# Patient Record
Sex: Female | Born: 1970 | Race: White | Hispanic: No | Marital: Married | Smoking: Never smoker
Health system: Southern US, Community
[De-identification: ages and names within clinical notes are randomized; demographics above are authoritative.]

## PROBLEM LIST (undated history)

## (undated) DIAGNOSIS — K219 Gastro-esophageal reflux disease without esophagitis: Secondary | ICD-10-CM

## (undated) DIAGNOSIS — G43909 Migraine, unspecified, not intractable, without status migrainosus: Secondary | ICD-10-CM

## (undated) HISTORY — DX: Migraine, unspecified, not intractable, without status migrainosus: G43.909

## (undated) HISTORY — PX: DILATION AND CURETTAGE OF UTERUS: SHX78

## (undated) HISTORY — DX: Gastro-esophageal reflux disease without esophagitis: K21.9

---

## 1997-05-12 HISTORY — PX: OTHER SURGICAL HISTORY: SHX169

## 2002-05-12 HISTORY — PX: CHOLECYSTECTOMY: SHX55

## 2010-02-18 ENCOUNTER — Ambulatory Visit: Payer: Self-pay | Admitting: Internal Medicine

## 2010-02-27 ENCOUNTER — Ambulatory Visit: Payer: Self-pay | Admitting: Internal Medicine

## 2010-02-27 ENCOUNTER — Ambulatory Visit (HOSPITAL_COMMUNITY): Admission: RE | Admit: 2010-02-27 | Discharge: 2010-02-27 | Payer: Self-pay | Admitting: Internal Medicine

## 2010-03-25 ENCOUNTER — Ambulatory Visit: Payer: Self-pay | Admitting: Internal Medicine

## 2012-05-12 HISTORY — PX: BREAST ENHANCEMENT SURGERY: SHX7

## 2013-03-02 ENCOUNTER — Ambulatory Visit (INDEPENDENT_AMBULATORY_CARE_PROVIDER_SITE_OTHER): Payer: BC Managed Care – PPO | Admitting: Neurology

## 2013-03-02 ENCOUNTER — Encounter: Payer: Self-pay | Admitting: Neurology

## 2013-03-02 VITALS — BP 100/76 | HR 60 | Temp 98.1°F | Ht 60.25 in | Wt 111.0 lb

## 2013-03-02 DIAGNOSIS — R200 Anesthesia of skin: Secondary | ICD-10-CM

## 2013-03-02 DIAGNOSIS — R51 Headache: Secondary | ICD-10-CM

## 2013-03-02 DIAGNOSIS — R209 Unspecified disturbances of skin sensation: Secondary | ICD-10-CM

## 2013-03-02 MED ORDER — RIZATRIPTAN BENZOATE 10 MG PO TBDP: 10.0000 mg | ORAL_TABLET | ORAL | Status: DC | PRN

## 2013-03-02 MED ORDER — AMITRIPTYLINE HCL 10 MG PO TABS: 10.0000 mg | ORAL_TABLET | Freq: Every day | ORAL | Status: DC

## 2013-03-02 NOTE — Progress Notes (Signed)
NEUROLOGY CONSULTATION NOTE  Denise Warren MRN: [id number] DOB: 18-Oct-1970  Referring provider: Dr. Sherril Croon Primary care provider: Dr. Sherril Croon  Reason for consult:  Chronic daily headaches.  HISTORY OF PRESENT ILLNESS: Denise Warren is a 42 year old right-handed woman with back pain and biliary dyskniesia who presents for headache.  She is accompanied by her husband.  Records and images were personally reviewed where available.    Onset:  August.  Two types. Location:  1) Type is at base of skull to holocephalic.  2) bi-temporal radiating to top of head (in coronal distribution).  On occasion, brief stabbing pain in left ear. Quality:  1)  Dull, nonthrobbing, burning scalp at back of head.  2)  pounding Intensity:  1)  5-6/10.  2)  8/10 Associated symptoms:  Nausea, photophobia, phonophobia, osmophobia, sound of frying bacon in right ear.  Swiggly dark lines in both eyes.  Also notes some mild numbness in right side of face. Aura:  No Duration:  1)  Constant.  2) 3 days Frequency:  1) Constant (about 12-15 headache-free days over past 3 months).  2)  1 day a week Activity:  Able to remain active but difficult.  Reduced appetite. Triggers/exacerbating factors:  none Relieving factors:  none  Past abortive therapy:  Imitrex (takes edge off), Alleve (ineffective) Past preventative therapy:  Propranolol (bradycardia, light headed)  Current abortive therapy: Imitrex 100mg  (takes edge off), Nubain (ineffective), Phenergan  Current preventative therapy:  Topamax 200mg  (ineffective)  Alcohol:  no Caffeine:  no Sleep hygiene:  Sleeps okay Stress/depression:  no Personal history of headache:  Yes.  Migraines, usually right periorbital, pulsating, once every 2-3 months.  Responded well to imitrex Family history of headache:  Mom, maternal aunt.  PAST MEDICAL HISTORY: Past Medical History  Diagnosis Date  . Migraines   . GERD (gastroesophageal reflux disease)     PAST SURGICAL  HISTORY: Past Surgical History  Procedure Laterality Date  . Right breast lumpectomy  1999  . Cholecystectomy  2004  . Dilation and curettage of uterus    . Breast enhancement surgery  2014    MEDICATIONS: No current outpatient prescriptions on file prior to visit.   No current facility-administered medications on file prior to visit.    ALLERGIES: Allergies  Allergen Reactions  . Sulfa Antibiotics     Stomach cramps; vomiting    FAMILY HISTORY: No family history on file.  SOCIAL HISTORY: History   Social History  . Marital Status: Married    Spouse Name: N/A    Number of Children: N/A  . Years of Education: N/A   Occupational History  . Not on file.   Social History Main Topics  . Smoking status: Never Smoker   . Smokeless tobacco: Never Used  . Alcohol Use: No     Comment: once a month  . Drug Use: No  . Sexual Activity: Not on file   Other Topics Concern  . Not on file   Social History Narrative  . No narrative on file    REVIEW OF SYSTEMS: Constitutional: No fevers, chills, or sweats, no generalized fatigue, change in appetite Eyes: No visual changes, double vision, eye pain Ear, nose and throat: No hearing loss, ear pain, nasal congestion, sore throat Cardiovascular: No chest pain, palpitations Respiratory:  No shortness of breath at rest or with exertion, wheezes GastrointestinaI: No nausea, vomiting, diarrhea, abdominal pain, fecal incontinence Genitourinary:  No dysuria, urinary retention or frequency Musculoskeletal:  No neck pain, back  pain Integumentary: No rash, pruritus, skin lesions Neurological: as above Psychiatric: No depression, insomnia, anxiety Endocrine: No palpitations, fatigue, diaphoresis, mood swings, change in appetite, change in weight, increased thirst Hematologic/Lymphatic:  No anemia, purpura, petechiae. Allergic/Immunologic: no itchy/runny eyes, nasal congestion, recent allergic reactions, rashes  PHYSICAL EXAM: Filed  Vitals:   03/02/13 1342  BP: 100/76  Pulse: 60  Temp: 98.1 F (36.7 C)   General: In mild distress.  Sitting with lights out. Head:  Normocephalic/atraumatic Neck: supple, bilateral paraspinal tenderness with particular tenderness at occipital notch, full range of motion Back: No paraspinal tenderness Heart: regular rate and rhythm Lungs: Clear to auscultation bilaterally. Vascular: No carotid bruits. Neurological Exam: Mental status: alert and oriented to person, place, and time, speech fluent and not dysarthric, language intact. Cranial nerves: CN I: not tested CN II: pupils equal, round and reactive to light, visual fields intact, fundi unremarkable. CN III, IV, VI:  full range of motion, no nystagmus, no ptosis CN V: endorses mild reduced sensation of right V2 distribution. CN VII: upper and lower face symmetric CN VIII: hearing intact CN IX, X: gag intact, uvula midline CN XI: sternocleidomastoid and trapezius muscles intact CN XII: tongue midline Bulk & Tone: normal, no fasciculations. Motor: 5/5 throughout Sensation: temperature and vibration intact Deep Tendon Reflexes: 2+ throughout, toes down Finger to nose testing: normal Heel to shin: normal Gait: normal.  Able to walk on toes, heels and in tandem. Romberg negative.  IMPRESSION: 1.  Chronic migraine without aura. 2.  Occipital neuralgia  PLAN: 1.  Performed bilateral occipital nerve blocks in office. 2.  Start amitriptyline 10mg  qhs.  Continue topamax 200mg .  Call in one month and may adjust dose if needed. 3.  For abortive therapy, try Maxalt. 4.  Instructed to call in one week.  If headaches not improved and still constant, will prescribe prednisone taper. 5.  Follow up in 2 months.  Thank you for allowing me to take part in the care of this patient.  Shon Millet, DO  CC:  Doreen Beam, MD

## 2013-03-02 NOTE — Patient Instructions (Addendum)
1.  We gave you bilateral occipital nerve blocks today.  Hopefully that will relieve some of the headache. 2.  Start taking the amitriptyline 10mg  at bedtime.  Side effects include sleepiness and dizziness.  Call in one month with update. 3.  Call in one week.  If no relief, then I will prescribe you prednisone taper to try and break cycle of headache. 4.  For acute headache attacks, take Maxalt at earliest onset of headache.  May repeat in 2 hours if needed.  Do not take pain medications more than 2 days out of the week to prevent rebound headache. 5.  Follow up in 2 months. 6.  Since this is a new severe headache, we will get MRI.   Your MRI is scheduled at [facility] on Wednesday, October 29th at 4:00 pm. Please check in at the first floor radiology department 15 minutes prior to your scheduled appointment time. Enter the [facility].     [phone].

## 2013-03-03 NOTE — Procedures (Signed)
Procedure: Occipital nerve block  Procedure risks, benefits and alternative treatments explained to patient and they agree to proceed.   Risks and benefits discussed with patient prior to procedure and patient wished to proceed. A concentration of 0.25% bupivacaine (1 ml) was mixed with 40 mg of Kenalog (1 ml). A 22 gauge needle was used for injection. The region of bilateral greater occipital nerves was located by palpation. The area was prepped with alcohol. A total of 4 cc of the above mixture was injected without difficulty. The patient felt lightheaded briefly and laid down on the table for a few minutes, but then felt well.    Adam R. Everlena Cooper, DO

## 2013-03-09 ENCOUNTER — Ambulatory Visit (HOSPITAL_COMMUNITY)
Admission: RE | Admit: 2013-03-09 | Discharge: 2013-03-09 | Disposition: A | Discharge status: Home / Self Care | Payer: BC Managed Care – PPO | Source: Ambulatory Visit | Attending: Neurology | Admitting: Neurology

## 2013-03-09 DIAGNOSIS — R200 Anesthesia of skin: Secondary | ICD-10-CM

## 2013-03-09 DIAGNOSIS — R209 Unspecified disturbances of skin sensation: Secondary | ICD-10-CM | POA: Insufficient documentation

## 2013-03-09 DIAGNOSIS — R51 Headache: Secondary | ICD-10-CM | POA: Insufficient documentation

## 2013-03-09 MED ORDER — GADOBENATE DIMEGLUMINE 529 MG/ML IV SOLN
10.0000 mL | Freq: Once | INTRAVENOUS | Status: AC
  Administered 2013-03-09: 10 mL via INTRAVENOUS

## 2013-03-10 ENCOUNTER — Telehealth: Payer: Self-pay | Admitting: Neurology

## 2013-03-10 NOTE — Telephone Encounter (Signed)
Spoke with Denise Warren. Information given as per Dr. Everlena Cooper below re: MRI. She reports that her HA were markedly better up until last Wednesday--had a dull HA all day long but today she is again HA free. She states she thinks things are better overall. I encouraged her to call if she had significant changes. She states she will. **Dr. Everlena Cooper, [place] Childes.

## 2013-03-10 NOTE — Telephone Encounter (Signed)
Message copied by Benay Spice on Thu Mar 10, 2013 10:13 AM ------      Message from: JAFFE, ADAM R      Created: Thu Mar 10, 2013  6:08 AM       Please let Ms. Kise know that her MRI is okay.  How are her headaches?  Any improvement after nerve blocks?      AJ      ----- Message -----         From: Rad Results In Interface         Sent: 03/10/2013   3:31 AM           To: Cira Servant, DO                   ------

## 2013-03-10 NOTE — Telephone Encounter (Signed)
Left a message for Denise Warren to return my call.

## 2013-05-03 ENCOUNTER — Encounter: Payer: Self-pay | Admitting: Neurology

## 2013-05-03 ENCOUNTER — Ambulatory Visit (INDEPENDENT_AMBULATORY_CARE_PROVIDER_SITE_OTHER): Payer: BC Managed Care – PPO | Admitting: Neurology

## 2013-05-03 VITALS — BP 102/68 | HR 60 | Temp 98.0°F | Resp 12 | Ht 60.25 in | Wt 122.6 lb

## 2013-05-03 DIAGNOSIS — G43009 Migraine without aura, not intractable, without status migrainosus: Secondary | ICD-10-CM

## 2013-05-03 NOTE — Progress Notes (Signed)
NEUROLOGY FOLLOW UP OFFICE NOTE  Denise Warren [id number]  HISTORY OF PRESENT ILLNESS: Denise Warren is a 42 year old right-handed woman with back pain and biliary dyskniesia who follows up for headache.  She is accompanied by her husband.  Records and images were personally reviewed where available.    Marked improvement since last visit.  She responded well to the occipital nerve blocks.  She is having markedly less frequent headaches and pretty much has 2-3 headaches a month now.  She is able to perform her daily activities and able to exercise again.  Her mood has improved as well.  She says she feels like her old self and feels well.  Since starting the amitriptyline, she has gained about 11 lbs.  She also has had 2 or 3 episodes of flushing sensation.  Otherwise, she feels well.  Onset:  August.  Two types. Location:   1) at base of skull to holocephalic.   2) bi-temporal radiating to top of head (in coronal distribution).  On occasion, brief stabbing pain in left ear. Quality:   1) Dull, nonthrobbing, burning scalp at back of head.   2) pounding Intensity:   1) 5-6/10.   2) 8/10 Associated symptoms:  Nausea, photophobia, phonophobia, osmophobia, sound of frying bacon in right ear.  Swiggly dark lines in both eyes.  Also notes some mild numbness in right side of face. Aura:  No Duration:   1) Constant.  NOW: an hour or so. 2) 3 days  NOW:  An hour or so Frequency:   1) Constant (about 12-15 headache-free days over past 3 months).  Now:  27-28 headache-free days per month.  Usually more frequent around menses. 2)  1 day a week  NOW:  1 every 7 to 10 days Activity:  Able to remain active but difficult.  Reduced appetite. Triggers/exacerbating factors:  none Relieving factors:  none  Past abortive therapy:  Imitrex (takes edge off), Alleve (ineffective), Nubain (ineffective) Past preventative therapy:  Propranolol (bradycardia, light headed), Topamax 200mg   (ineffective)  Current abortive therapy: Maxalt,  performed bilateral occipital nerve blocks. Current preventative therapy: amitriptyline 10mg   MRI of brain with and without contrast performed on 03/10/13 was normal.  Alcohol:  no Caffeine:  no Sleep hygiene:  Sleeps okay Stress/depression:  no Personal history of headache:  Yes.  Migraines, usually right periorbital, pulsating, once every 2-3 months.  Responded well to imitrex Family history of headache:  Mom, maternal aunt.  PAST MEDICAL HISTORY: Past Medical History  Diagnosis Date  . Migraines   . GERD (gastroesophageal reflux disease)     MEDICATIONS: Current Outpatient Prescriptions on File Prior to Visit  Medication Sig Dispense Refill  . amitriptyline (ELAVIL) 10 MG tablet Take 1 tablet (10 mg total) by mouth at bedtime.  30 tablet  3  . FLUoxetine (PROZAC) 20 MG capsule Take 20 mg by mouth daily.      . pantoprazole (PROTONIX) 40 MG tablet Take 40 mg by mouth daily.      . rizatriptan (MAXALT-MLT) 10 MG disintegrating tablet Take 1 tablet (10 mg total) by mouth as needed for migraine. May repeat in 2 hours if needed  9 tablet  11   No current facility-administered medications on file prior to visit.    ALLERGIES: Allergies  Allergen Reactions  . Sulfa Antibiotics     Stomach cramps; vomiting    FAMILY HISTORY: History reviewed. No pertinent family history.  SOCIAL HISTORY: History   Social  History  . Marital Status: Married    Spouse Name: N/A    Number of Children: N/A  . Years of Education: N/A   Occupational History  . Not on file.   Social History Main Topics  . Smoking status: Never Smoker   . Smokeless tobacco: Never Used  . Alcohol Use: Yes     Comment: once a month  . Drug Use: No  . Sexual Activity: Not on file   Other Topics Concern  . Not on file   Social History Narrative  . No narrative on file    REVIEW OF SYSTEMS: Constitutional: No fevers, chills, or sweats, no generalized  fatigue, change in appetite Eyes: No visual changes, double vision, eye pain Ear, nose and throat: No hearing loss, ear pain, nasal congestion, sore throat Cardiovascular: No chest pain, palpitations Respiratory:  No shortness of breath at rest or with exertion, wheezes GastrointestinaI: No nausea, vomiting, diarrhea, abdominal pain, fecal incontinence Genitourinary:  No dysuria, urinary retention or frequency Musculoskeletal:  No neck pain, back pain Integumentary: No rash, pruritus, skin lesions Neurological: as above Psychiatric: No depression, insomnia, anxiety Endocrine: No, fatigue, diaphoresis, mood swings.  Notes increased hunger, weight gain and rare episodic flushing. Hematologic/Lymphatic:  No anemia, purpura, petechiae. Allergic/Immunologic: no itchy/runny eyes, nasal congestion, recent allergic reactions, rashes  PHYSICAL EXAM: Filed Vitals:   05/03/13 1530  BP: 102/68  Pulse: 60  Temp: 98 F (36.7 C)  Resp: 12   General: No acute distress Head:  Normocephalic/atraumatic   IMPRESSION: Migraine without aura, improved.  PLAN: 1.  Continue amitriptyline 10mg .  We will continue to monitor her weight.  Not sure if stopping the topamax may have played a role as well. 2.  Maxalt for abortive therapy. 3.  Continue exercise and healthy diet. 4.  Follow up in 3 months.  30 minutes spent with patient, 100% spent counseling and coordinating care.  Shon Millet, DO  CC:  Doreen Beam, MD

## 2013-05-03 NOTE — Patient Instructions (Signed)
1.  Continue amitriptyline 10mg  at bedtime. 2.  Continue Maxalt. 3.  Continue exercise and healthy diet. 4.  Follow up in 3 months.

## 2013-08-02 ENCOUNTER — Encounter: Payer: Self-pay | Admitting: Neurology

## 2013-08-02 ENCOUNTER — Ambulatory Visit (INDEPENDENT_AMBULATORY_CARE_PROVIDER_SITE_OTHER): Payer: BC Managed Care – PPO | Admitting: Neurology

## 2013-08-02 VITALS — BP 98/68 | HR 70 | Resp 16 | Ht 61.0 in | Wt 121.1 lb

## 2013-08-02 DIAGNOSIS — G43009 Migraine without aura, not intractable, without status migrainosus: Secondary | ICD-10-CM

## 2013-08-02 MED ORDER — AMITRIPTYLINE HCL 25 MG PO TABS: 25.0000 mg | ORAL_TABLET | Freq: Every day | ORAL | Status: DC

## 2013-08-02 NOTE — Patient Instructions (Signed)
1.  We will increase the amitriptyline to 25mg  tablets, one tablet at bedtime.  In the meantime, take 2 10mg  tablets at night until you are finished. 2.  Follow up in 6 months but call with questions or concerns. 3.  Keep headache diary/calendar.

## 2013-08-02 NOTE — Progress Notes (Signed)
NEUROLOGY FOLLOW UP OFFICE NOTE  Denise Warren [id number]  HISTORY OF PRESENT ILLNESS: Denise Warren is a 43 year old right-handed woman with back pain and biliary dyskniesia who follows up for migraine without aura.  She is accompanied by her husband.  Records and images were personally reviewed where available.    UPDATE: Doing well.  No weight gain.  Does note some dry mouth.  Plans on running a marathon in 3 weeks. Duration:  30 minutes or so. Frequency:  2-3 days/month for type 1, 2 days per month for type 2. Current abortive therapy:  Maxalt for type 2, Aleve for type 1. Current preventative therapy:  amitriptyline 10mg .  HISTORY:  Marked improvement since last visit.  She responded well to the occipital nerve blocks.  She is having markedly less frequent headaches and pretty much has 2-3 headaches a month now.  She is able to perform her daily activities and able to exercise again.  Her mood has improved as well.  She says she feels like her old self and feels well.  Since starting the amitriptyline, she has gained about 11 lbs.  She also has had 2 or 3 episodes of flushing sensation.  Otherwise, she feels well.   Onset:  August.  Two types. Location:  1) at base of skull to holocephalic.                       2) bi-temporal radiating to top of head (in coronal distribution).  On occasion, brief stabbing pain in left ear. Quality:  1) Dull, nonthrobbing, burning scalp at back of head.                     2) pounding Initial Intensity:  1) 5-6/10.                                  2) 8/10 Associated symptoms:  Nausea, photophobia, phonophobia, osmophobia, sound of frying bacon in right ear.  Swiggly dark lines in both eyes.  Also notes some mild numbness in right side of face. Aura:  No Initial Duration:  1) Constant.  05/03/13: an hour or so.                               2) 3 days.  05/03/13:  An hour or so Initial Frequency:  1) Constant (about 12-15 headache-free  days over past 3 months).  05/03/13: 27-28 headache-free days per month.  Usually more frequent around menses.                                   2)  1 day a week.  05/03/13:  1 every 7 to 10 days Activity:  Able to remain active but difficult.  Reduced appetite. Triggers/exacerbating factors:  none Relieving factors:  none  Past abortive therapy:  Imitrex (takes edge off), Alleve (ineffective), Nubain (ineffective), bilateral occipital nerve blocks (effective) Past preventative therapy:  Propranolol (bradycardia, light headed), Topamax 200mg  (ineffective)  MRI of brain with and without contrast performed on 03/10/13 was normal.  Alcohol:  no Caffeine:  no Sleep hygiene:  Sleeps okay Stress/depression:  no Personal history of headache:  Yes.  Migraines, usually right periorbital, pulsating, once every 2-3 months.  Responded well  to imitrex Family history of headache:  Mom, maternal aunt.  PAST MEDICAL HISTORY: Past Medical History  Diagnosis Date  . Migraines   . GERD (gastroesophageal reflux disease)     MEDICATIONS: Current Outpatient Prescriptions on File Prior to Visit  Medication Sig Dispense Refill  . FLUoxetine (PROZAC) 20 MG capsule Take 20 mg by mouth daily.      . pantoprazole (PROTONIX) 40 MG tablet Take 40 mg by mouth daily.      . rizatriptan (MAXALT-MLT) 10 MG disintegrating tablet Take 1 tablet (10 mg total) by mouth as needed for migraine. May repeat in 2 hours if needed  9 tablet  11   No current facility-administered medications on file prior to visit.    ALLERGIES: Allergies  Allergen Reactions  . Sulfa Antibiotics     Stomach cramps; vomiting    FAMILY HISTORY: Family History  Problem Relation Age of Onset  . Migraines Mother   . Migraines Maternal Aunt   . Ataxia Neg Hx   . Chorea Neg Hx   . Dementia Neg Hx   . Mental retardation Neg Hx   . Multiple sclerosis Neg Hx   . Neurofibromatosis Neg Hx   . Neuropathy Neg Hx   . Parkinsonism Neg Hx     . Seizures Neg Hx   . Stroke Neg Hx     SOCIAL HISTORY: History   Social History  . Marital Status: Married    Spouse Name: N/A    Number of Children: N/A  . Years of Education: N/A   Occupational History  . Not on file.   Social History Main Topics  . Smoking status: Never Smoker   . Smokeless tobacco: Never Used  . Alcohol Use: Yes     Comment: once a month  . Drug Use: No  . Sexual Activity: Not on file   Other Topics Concern  . Not on file   Social History Narrative  . No narrative on file    REVIEW OF SYSTEMS: Constitutional: No fevers, chills, or sweats, no generalized fatigue, change in appetite Eyes: No visual changes, double vision, eye pain Ear, nose and throat: No hearing loss, ear pain, nasal congestion, sore throat Cardiovascular: No chest pain, palpitations Respiratory:  No shortness of breath at rest or with exertion, wheezes GastrointestinaI: No nausea, vomiting, diarrhea, abdominal pain, fecal incontinence Genitourinary:  No dysuria, urinary retention or frequency Musculoskeletal:  No neck pain, back pain Integumentary: No rash, pruritus, skin lesions Neurological: as above Psychiatric: No depression, insomnia, anxiety Endocrine: No palpitations, fatigue, diaphoresis, mood swings, change in appetite, change in weight, increased thirst Hematologic/Lymphatic:  No anemia, purpura, petechiae. Allergic/Immunologic: no itchy/runny eyes, nasal congestion, recent allergic reactions, rashes  PHYSICAL EXAM: Filed Vitals:   08/02/13 1535  BP: 98/68  Pulse: 70  Resp: 16   General: No acute distress Head:  Normocephalic/atraumatic Neck: supple, no paraspinal tenderness, full range of motion Heart:  Regular rate and rhythm Lungs:  Clear to auscultation bilaterally Back: No paraspinal tenderness Neurological Exam: alert and oriented to person, place, and time. Attention span and concentration intact, recent and remote memory intact, fund of knowledge  intact.  Speech fluent and not dysarthric, language intact.  CN II-XII intact. Fundoscopic exam unremarkable without vessel changes, exudates, hemorrhages or papilledema.  Bulk and tone normal, muscle strength 5/5 throughout.  Sensation to light touch, temperature and vibration intact.  Deep tendon reflexes 2+ throughout, toes downgoing.  Finger to nose and heel to shin testing intact.  Gait normal, Romberg negative.  IMPRESSION: Migraine without aura.  Much improved.  PLAN: 1.  Will try to achieve further reduction of migraines by increasing amitriptyline to 25mg  at bedtime 2.  Continue Maxalt and Aleve for abortive therapy. 3.  Follow up in 6 months.  Shon MilletAdam Jaffe, DO  CC:  Doreen Beamhruv Vyas, MD

## 2013-08-15 ENCOUNTER — Other Ambulatory Visit: Payer: Self-pay | Admitting: *Deleted

## 2013-08-15 MED ORDER — AMITRIPTYLINE HCL 25 MG PO TABS: 25.0000 mg | ORAL_TABLET | Freq: Every day | ORAL | Status: DC

## 2013-08-17 ENCOUNTER — Encounter: Payer: Self-pay | Admitting: Neurology

## 2013-08-17 ENCOUNTER — Ambulatory Visit (INDEPENDENT_AMBULATORY_CARE_PROVIDER_SITE_OTHER): Payer: BC Managed Care – PPO | Admitting: Neurology

## 2013-08-17 ENCOUNTER — Ambulatory Visit: Payer: BC Managed Care – PPO | Admitting: Neurology

## 2013-08-17 VITALS — BP 98/64 | HR 64 | Resp 14 | Ht 60.25 in | Wt 116.0 lb

## 2013-08-17 DIAGNOSIS — R51 Headache: Secondary | ICD-10-CM

## 2013-08-17 MED ORDER — KETOROLAC TROMETHAMINE 60 MG/2ML IM SOLN
60.0000 mg | Freq: Once | INTRAMUSCULAR | Status: AC
  Administered 2013-08-17: 60 mg via INTRAMUSCULAR

## 2013-08-17 MED ORDER — TRIAMCINOLONE ACETONIDE 40 MG/ML IJ SUSP
40.0000 mg | Freq: Once | INTRAMUSCULAR | Status: AC
  Administered 2013-08-17: 40 mg via INTRAMUSCULAR

## 2013-08-17 MED ORDER — BUPIVACAINE HCL 0.25 % IJ SOLN
3.0000 mL | Freq: Once | INTRAMUSCULAR | Status: AC
  Administered 2013-08-17: 3 mL

## 2013-08-17 NOTE — Procedures (Signed)
Procedure: Occipital nerve block  Procedure risks, benefits and alternative treatments explained to patient and they agree to proceed.   A concentration of 0.25% bupivacaine (3 ml) was mixed with 40 mg of Kenalog (1 ml). A 30 gauge needle was used for injection. The regions of the left and right greater occipital nerves were located by palpation. The area was prepped with alcohol. A total of 4 cc of the above mixture was injected without difficulty. The patient tolerated the procedure well.    Adam R. Everlena CooperJaffe, DO

## 2013-08-17 NOTE — Progress Notes (Signed)
See procedure note.

## 2013-11-25 ENCOUNTER — Other Ambulatory Visit: Payer: Self-pay | Admitting: *Deleted

## 2013-11-25 MED ORDER — AMITRIPTYLINE HCL 25 MG PO TABS: 25.0000 mg | ORAL_TABLET | Freq: Every day | ORAL | Status: DC

## 2014-01-23 ENCOUNTER — Ambulatory Visit (INDEPENDENT_AMBULATORY_CARE_PROVIDER_SITE_OTHER): Payer: BC Managed Care – PPO | Admitting: Neurology

## 2014-01-23 ENCOUNTER — Encounter: Payer: Self-pay | Admitting: Neurology

## 2014-01-23 VITALS — BP 110/70 | HR 70 | Resp 16 | Ht 60.0 in | Wt 125.2 lb

## 2014-01-23 DIAGNOSIS — G43019 Migraine without aura, intractable, without status migrainosus: Secondary | ICD-10-CM

## 2014-01-23 MED ORDER — PREDNISONE 10 MG PO TABS: ORAL_TABLET | ORAL | Status: AC

## 2014-01-23 MED ORDER — AMITRIPTYLINE HCL 25 MG PO TABS: 50.0000 mg | ORAL_TABLET | Freq: Every day | ORAL | Status: DC

## 2014-01-23 MED ORDER — SUMATRIPTAN 5 MG/ACT NA SOLN: NASAL | Status: AC

## 2014-01-23 NOTE — Progress Notes (Signed)
NEUROLOGY FOLLOW UP OFFICE NOTE  Denise Warren [id number]  HISTORY OF PRESENT ILLNESS: Denise Warren is a 43 year old right-handed woman with back pain and biliary dyskniesia who follows up for migraine without aura.  She is accompanied by her husband.    UPDATE: Following occipital nerve block in April, she steadily had an increase in headache frequency per month. Duration: 12 hours Frequency:  In August, she had 6 type 1 and 6 type 2 headaches (total 12 headaches per month) Current abortive therapy:  Maxalt + Aleve (she then sleeps for a couple of hours.  1/3 of time, the headache recurs); rarely takes BC or Aleve. Current preventative therapy:  amitriptyline .  HISTORY: Marked improvement since last visit.  She responded well to the occipital nerve blocks.  She is having markedly less frequent headaches and pretty much has 2-3 headaches a month now.  She is able to perform her daily activities and able to exercise again.  Her mood has improved as well.  She says she feels like her old self and feels well.  Since starting the amitriptyline, she has gained about 11 lbs.  She also has had 2 or 3 episodes of flushing sensation.  Otherwise, she feels well.  Onset:  August.  Two types. Location:  1) at base of skull to holocephalic.                        2) bi-temporal radiating to top of head (in coronal distribution).  On occasion, brief stabbing pain in left ear. Quality:  1) Dull, nonthrobbing, burning scalp at back of head.                      2) pounding Initial Intensity:  1) 5-6/10                               2) 8/10 Associated symptoms:  Nausea, photophobia, phonophobia, osmophobia, sound of frying bacon in right ear.  Swiggly dark lines in both eyes.  Reduced appetite  Also notes some mild numbness in right side of face. Aura:  No Initial Duration:  1) Constant;March 30 minutes                               2) 3 days; March 30 minutes Initial Frequency:  1)  Constant (about 12-15 headache-free days over past 3 months).    2-3 days/month.  Usually more frequent around menses.                                   2)  constant; March 2 days per month. Activity:  Able to remain active but difficult.  Reduced appetite. Triggers/exacerbating factors:  none Relieving factors:  none  Past abortive therapy:  Imitrex (takes edge off), Alleve (ineffective), Nubain (ineffective), bilateral occipital nerve blocks (effective) Past preventative therapy:  Propranolol (bradycardia, light headed), Topamax  (ineffective)  MRI of brain with and without contrast performed on 03/10/13 was normal.  Alcohol:  no Caffeine:  no Sleep hygiene:  Sleeps okay Stress/depression:  no Personal history of headache:  Yes.  Migraines, usually right periorbital, pulsating, once every 2-3 months.  Responded well to imitrex Family history of headache:  Mom, maternal aunt.  PAST MEDICAL HISTORY:  Past Medical History  Diagnosis Date  . Migraines   . GERD (gastroesophageal reflux disease)     MEDICATIONS: Current Outpatient Prescriptions on File Prior to Visit  Medication Sig Dispense Refill  . fexofenadine-pseudoephedrine (ALLEGRA-D 24) 180-240 MG per 24 hr tablet Take 1 tablet by mouth daily.      Marland Kitchen FLUoxetine (PROZAC) 20 MG capsule Take 20 mg by mouth daily.      . pantoprazole (PROTONIX) 40 MG tablet Take 40 mg by mouth daily.       No current facility-administered medications on file prior to visit.    ALLERGIES: Allergies  Allergen Reactions  . Sulfa Antibiotics     Stomach cramps; vomiting  . Vancomycin     FAMILY HISTORY: Family History  Problem Relation Age of Onset  . Migraines Mother   . Migraines Maternal Aunt   . Ataxia Neg Hx   . Chorea Neg Hx   . Dementia Neg Hx   . Mental retardation Neg Hx   . Multiple sclerosis Neg Hx   . Neurofibromatosis Neg Hx   . Neuropathy Neg Hx   . Parkinsonism Neg Hx   . Seizures Neg Hx   . Stroke Neg Hx      SOCIAL HISTORY: History   Social History  . Marital Status: Married    Spouse Name: N/A    Number of Children: N/A  . Years of Education: N/A   Occupational History  . Not on file.   Social History Main Topics  . Smoking status: Never Smoker   . Smokeless tobacco: Never Used  . Alcohol Use: Yes     Comment: once a month  . Drug Use: No  . Sexual Activity: Yes    Partners: Male   Other Topics Concern  . Not on file   Social History Narrative  . No narrative on file    REVIEW OF SYSTEMS: Constitutional: No fevers, chills, or sweats, no generalized fatigue, change in appetite Eyes: No visual changes, double vision, eye pain Ear, nose and throat: No hearing loss, ear pain, nasal congestion, sore throat Cardiovascular: No chest pain, palpitations Respiratory:  No shortness of breath at rest or with exertion, wheezes GastrointestinaI: No nausea, vomiting, diarrhea, abdominal pain, fecal incontinence Genitourinary:  No dysuria, urinary retention or frequency Musculoskeletal:  No neck pain, back pain Integumentary: No rash, pruritus, skin lesions Neurological: as above Psychiatric: No depression, insomnia, anxiety Endocrine: No palpitations, fatigue, diaphoresis, mood swings, change in appetite, change in weight, increased thirst Hematologic/Lymphatic:  No anemia, purpura, petechiae. Allergic/Immunologic: no itchy/runny eyes, nasal congestion, recent allergic reactions, rashes  PHYSICAL EXAM: Filed Vitals:   01/23/14 1249  BP: 110/70  Pulse: 70  Resp: 16   General: No acute distress Head:  Normocephalic/atraumatic, tenderness to suboccipital regions bilaterally Neck: supple, no paraspinal tenderness, full range of motion Heart:  Regular rate and rhythm Lungs:  Clear to auscultation bilaterally Back: No paraspinal tenderness Neurological Exam: alert and oriented to person, place, and time. Attention span and concentration intact, recent and remote memory intact,  fund of knowledge intact.  Speech fluent and not dysarthric, language intact.  CN II-XII intact. Fundoscopic exam unremarkable without vessel changes, exudates, hemorrhages or papilledema.  Bulk and tone normal, muscle strength 5/5 throughout.  Sensation to light touch, temperature and vibration intact.  Deep tendon reflexes 2+ throughout, toes downgoing.  Finger to nose and heel to shin testing intact.  Gait normal, Romberg negative.  IMPRESSION: Migraine without aura  PLAN: 1.  Increase amitriptyline to  at bedtime.  Call in 4 weeks. 2.  At earliest onset of headache, take sumatriptan  spray.  Put 1 spray into each nostril once.  May repeat one time in 2 hours if needed.  Do not exceed taking it or any pain reliever more than 2 days out of the week.  Stop Maxalt 3.  Will give you a prednisone taper to help break this daily headache.  Take 6tabs x1day, then 5tabs x1day, then 4tabs x1day, then 3tabs x1day, then 2tabs x1day, then 1tab x1day, then STOP 4.  Call in 4 weeks.  Follow up in 3months  Shon Millet, DO  CC: Doreen Beam, MD

## 2014-01-23 NOTE — Patient Instructions (Addendum)
1.  Increase amitriptyline to  at bedtime.  Call in 4 weeks. 2.  At earliest onset of headache, take sumatriptan  spray.  Put 1 spray into each nostril once.  May repeat one time in 2 hours if needed.  Do not exceed taking it or any pain reliever more than 2 days out of the week.  Stop Maxalt 3.  Will give you a prednisone taper to help break this daily headache.  Take 6tabs x1day, then 5tabs x1day, then 4tabs x1day, then 3tabs x1day, then 2tabs x1day, then 1tab x1day, then STOP 4.  Call in 4 weeks.  Follow up in 3months.

## 2014-02-03 ENCOUNTER — Ambulatory Visit: Payer: BC Managed Care – PPO | Admitting: Neurology

## 2014-02-24 ENCOUNTER — Ambulatory Visit (INDEPENDENT_AMBULATORY_CARE_PROVIDER_SITE_OTHER): Payer: BC Managed Care – PPO | Admitting: Neurology

## 2014-02-24 ENCOUNTER — Encounter: Payer: Self-pay | Admitting: Neurology

## 2014-02-24 VITALS — BP 96/60 | HR 70 | Resp 18 | Ht 60.0 in | Wt 123.6 lb

## 2014-02-24 DIAGNOSIS — G43719 Chronic migraine without aura, intractable, without status migrainosus: Secondary | ICD-10-CM

## 2014-02-24 DIAGNOSIS — G43019 Migraine without aura, intractable, without status migrainosus: Secondary | ICD-10-CM

## 2014-02-24 NOTE — Patient Instructions (Signed)
1. Increase amitriptyline 50mg  tablets to 1.5 tablets daily (75mg ).  Call when you run out.  Give this dose a month.  If not effective, will change to another agent. 2.  We will get you set up with Zecuity patch. 3.  Follow up in December.

## 2014-02-24 NOTE — Progress Notes (Signed)
NEUROLOGY FOLLOW UP OFFICE NOTE  Denise Warren [id number]  HISTORY OF PRESENT ILLNESS: Denise Warren is a 43 year old right-handed woman with back pain and biliary dyskinesia who follows up for migraine without aura and to discuss Zecuity, the sumatriptan patch.  UPDATE: She would like to discuss the new sumatriptan patch. She still has the mild headache daily.  She has had approximately 3 migraines over the past month, lasting 12 hours.  Current abortive therapy:  Sumatriptan 5mg  nasal spray (has been taking 1/2 dose to prolong its availability.  It helped half the time, but didn't help the other half.  Full dose seemed to help) Current preventative therapy:  amitriptyline 50mg .   HISTORY: Onset:  August.  Two types. Location:  1) at base of skull to holocephalic.                         2) bi-temporal radiating to top of head (in coronal distribution).  On occasion, brief stabbing pain in left ear. Quality:  1) Dull, nonthrobbing, burning scalp at back of head.                       2) pounding Initial Intensity:  1) 5-6/10                               2) 8/10 Associated symptoms:  Nausea, photophobia, phonophobia, osmophobia, sound of frying bacon in right ear.  Swiggly dark lines in both eyes.  Reduced appetite  Also notes some mild numbness in right side of face. Aura:  No Initial Duration:  1) Constant;September 12 hours                               2) 3 days; September 12 hours Initial Frequency:  1) Constant; September 6  days/month                                   2)  constant; September 6 days/month Activity:  Able to remain active but difficult.  Reduced appetite. Triggers/exacerbating factors:  none Relieving factors:  none  Past abortive therapy:  Imitrex (takes edge off), Alleve (ineffective), Nubain (ineffective), bilateral occipital nerve blocks (effective), Maxalt + Aleve, BC powder Past preventative therapy:  Topamax 200mg  (ineffective).  She never  started propranolol due to concern of low heart rate (however, it has been okay).  MRI of brain with and without contrast performed on 03/10/13 was normal.  Alcohol:  no Caffeine:  no Sleep hygiene:  Sleeps okay Stress/depression:  no Personal history of headache:  Yes.  Migraines, usually right periorbital, pulsating, once every 2-3 months.  Responded well to imitrex Family history of headache:  Mom, maternal aunt.  PAST MEDICAL HISTORY: Past Medical History  Diagnosis Date  . Migraines   . GERD (gastroesophageal reflux disease)     MEDICATIONS: Current Outpatient Prescriptions on File Prior to Visit  Medication Sig Dispense Refill  . amitriptyline (ELAVIL) 25 MG tablet Take 2 tablets (50 mg total) by mouth at bedtime.  60 tablet  0  . fexofenadine-pseudoephedrine (ALLEGRA-D 24) 180-240 MG per 24 hr tablet Take 1 tablet by mouth daily.      Marland Kitchen. FLUoxetine (PROZAC) 20 MG capsule Take 20 mg by mouth daily.      .Marland Kitchen  pantoprazole (PROTONIX) 40 MG tablet Take 40 mg by mouth daily.      . predniSONE (DELTASONE) 10 MG tablet Take 6tabs x1day, then 5tabs x1day, then 4tabs x1day, then 3tabs x1day, then 2tabs x1day, then 1tab x1day, then STOP  21 tablet  0  . SUMAtriptan (IMITREX) 5 MG/ACT nasal spray Put 1 spray into each nostril x1.  May repeat x1 after 2h  1 Inhaler  3   No current facility-administered medications on file prior to visit.    ALLERGIES: Allergies  Allergen Reactions  . Sulfa Antibiotics     Stomach cramps; vomiting  . Vancomycin     FAMILY HISTORY: Family History  Problem Relation Age of Onset  . Migraines Mother   . Migraines Maternal Aunt   . Ataxia Neg Hx   . Chorea Neg Hx   . Dementia Neg Hx   . Mental retardation Neg Hx   . Multiple sclerosis Neg Hx   . Neurofibromatosis Neg Hx   . Neuropathy Neg Hx   . Parkinsonism Neg Hx   . Seizures Neg Hx   . Stroke Neg Hx     SOCIAL HISTORY: History   Social History  . Marital Status: Married    Spouse  Name: N/A    Number of Children: N/A  . Years of Education: N/A   Occupational History  . Not on file.   Social History Main Topics  . Smoking status: Never Smoker   . Smokeless tobacco: Never Used  . Alcohol Use: Yes     Comment: once a month  . Drug Use: No  . Sexual Activity: Yes    Partners: Male   Other Topics Concern  . Not on file   Social History Narrative  . No narrative on file    REVIEW OF SYSTEMS: Constitutional: No fevers, chills, or sweats, no generalized fatigue, change in appetite Eyes: No visual changes, double vision, eye pain Ear, nose and throat: No hearing loss, ear pain, nasal congestion, sore throat Cardiovascular: No chest pain, palpitations Respiratory:  No shortness of breath at rest or with exertion, wheezes GastrointestinaI: No nausea, vomiting, diarrhea, abdominal pain, fecal incontinence Genitourinary:  No dysuria, urinary retention or frequency Musculoskeletal:  No neck pain, back pain Integumentary: No rash, pruritus, skin lesions Neurological: as above Psychiatric: No depression, insomnia, anxiety Endocrine: No palpitations, fatigue, diaphoresis, mood swings, change in appetite, change in weight, increased thirst Hematologic/Lymphatic:  No anemia, purpura, petechiae. Allergic/Immunologic: no itchy/runny eyes, nasal congestion, recent allergic reactions, rashes  PHYSICAL EXAM: Filed Vitals:   02/24/14 0803  BP: 96/60  Pulse: 70  Resp: 18   General: No acute distress Head:  Normocephalic/atraumatic  IMPRESSION: Chronic migraine without aura  PLAN: 1.  Today, we discussed Zecuity, the new sumatriptan patch.  We discussed how it is applied and administered.  We discussed contraindications, such as not taking more than 2 patches in 24 hours or taking any other triptan in 24 hours.  We discussed side effects.  We will start the process to get her a prescription. 2.  Since the frequency of migraines decreased, we will increase  amitriptyline to 75mg  to see if it reduces frequency of her daily headaches.  In 4 weeks, if ineffective, will likely start atenolol. 3.  Follow up in December.    20 minutes spent with patient, 100% spent discussing the Zecuity and coordinating plan regarding her migraines.  Shon MilletAdam Jaffe, DO  CC:  Doreen Beamhruv Vyas, MD

## 2014-02-27 ENCOUNTER — Telehealth: Payer: Self-pay | Admitting: Neurology

## 2014-02-27 NOTE — Telephone Encounter (Signed)
Pt called wanting to give you the correct fax number Fax# [phone] If you have any questions, call her at  [phone]

## 2014-03-07 ENCOUNTER — Telehealth: Payer: Self-pay | Admitting: Neurology

## 2014-03-07 NOTE — Telephone Encounter (Signed)
Brett CanalesSteve w/ [facility] is [facility]. Please call back at confirmed # [phone], opt 1. Any pharmacist should be able to assist you / Sherri S.

## 2014-03-07 NOTE — Telephone Encounter (Signed)
Spoke with Pharmacy [facility] is in process with insurance

## 2014-03-31 ENCOUNTER — Other Ambulatory Visit: Payer: Self-pay | Admitting: Neurology

## 2014-04-28 ENCOUNTER — Ambulatory Visit: Payer: BC Managed Care – PPO | Admitting: Neurology

## 2014-05-02 ENCOUNTER — Telehealth: Payer: Self-pay | Admitting: Neurology

## 2014-05-02 NOTE — Telephone Encounter (Signed)
Pt called to cancel her /fu appt on 05/03/14. Pt has to work and will call later to r/s.

## 2014-05-03 ENCOUNTER — Ambulatory Visit: Payer: BC Managed Care – PPO | Admitting: Neurology

## 2014-05-08 ENCOUNTER — Ambulatory Visit: Payer: BC Managed Care – PPO | Admitting: Neurology

## 2016-06-11 ENCOUNTER — Encounter: Payer: Self-pay | Admitting: Internal Medicine

## 2017-04-23 ENCOUNTER — Other Ambulatory Visit: Payer: Self-pay | Admitting: Neurosurgery

## 2017-04-23 DIAGNOSIS — M5020 Other cervical disc displacement, unspecified cervical region: Secondary | ICD-10-CM

## 2017-05-06 ENCOUNTER — Ambulatory Visit
Admission: RE | Admit: 2017-05-06 | Discharge: 2017-05-06 | Disposition: A | Discharge status: Home / Self Care | Payer: Commercial Managed Care - PPO | Source: Ambulatory Visit | Attending: Neurosurgery | Admitting: Neurosurgery

## 2017-05-06 DIAGNOSIS — M5020 Other cervical disc displacement, unspecified cervical region: Secondary | ICD-10-CM

## 2017-05-06 MED ORDER — TRIAMCINOLONE ACETONIDE 40 MG/ML IJ SUSP (RADIOLOGY)
60.0000 mg | Freq: Once | INTRAMUSCULAR | Status: AC
  Administered 2017-05-06: 60 mg via EPIDURAL

## 2017-05-06 MED ORDER — IOPAMIDOL (ISOVUE-M 300) INJECTION 61%
1.0000 mL | Freq: Once | INTRAMUSCULAR | Status: AC | PRN
  Administered 2017-05-06: 1 mL via EPIDURAL

## 2017-05-06 NOTE — Discharge Instructions (Signed)

## 2017-07-15 ENCOUNTER — Other Ambulatory Visit: Payer: Self-pay | Admitting: Neurosurgery

## 2017-07-15 DIAGNOSIS — M5020 Other cervical disc displacement, unspecified cervical region: Secondary | ICD-10-CM

## 2017-07-29 ENCOUNTER — Ambulatory Visit
Admission: RE | Admit: 2017-07-29 | Discharge: 2017-07-29 | Disposition: A | Discharge status: Home / Self Care | Payer: Commercial Managed Care - PPO | Source: Ambulatory Visit | Attending: Neurosurgery | Admitting: Neurosurgery

## 2017-07-29 DIAGNOSIS — M5020 Other cervical disc displacement, unspecified cervical region: Secondary | ICD-10-CM

## 2017-07-29 MED ORDER — TRIAMCINOLONE ACETONIDE 40 MG/ML IJ SUSP (RADIOLOGY)
60.0000 mg | Freq: Once | INTRAMUSCULAR | Status: AC
  Administered 2017-07-29: 60 mg via EPIDURAL

## 2017-07-29 MED ORDER — IOPAMIDOL (ISOVUE-M 300) INJECTION 61%
1.0000 mL | Freq: Once | INTRAMUSCULAR | Status: AC | PRN
  Administered 2017-07-29: 1 mL via EPIDURAL

## 2017-07-29 NOTE — Discharge Instructions (Signed)

## 2019-08-05 IMAGING — XA DG INJECT/[PERSON_NAME] INC NEEDLE/CATH/PLC EPI/CERV/THOR W/IMG
2 series · 2 of 2 positions shown · non-contrast
Comparison: none

CLINICAL DATA: Neck pain. BILATERAL shoulder pain. Partial
improvement after the first injection. Symptoms predominate on the
RIGHT

[Series 1: ortho standard · 1 of 1 slices shown (1 of 2)]
[im 1/1]
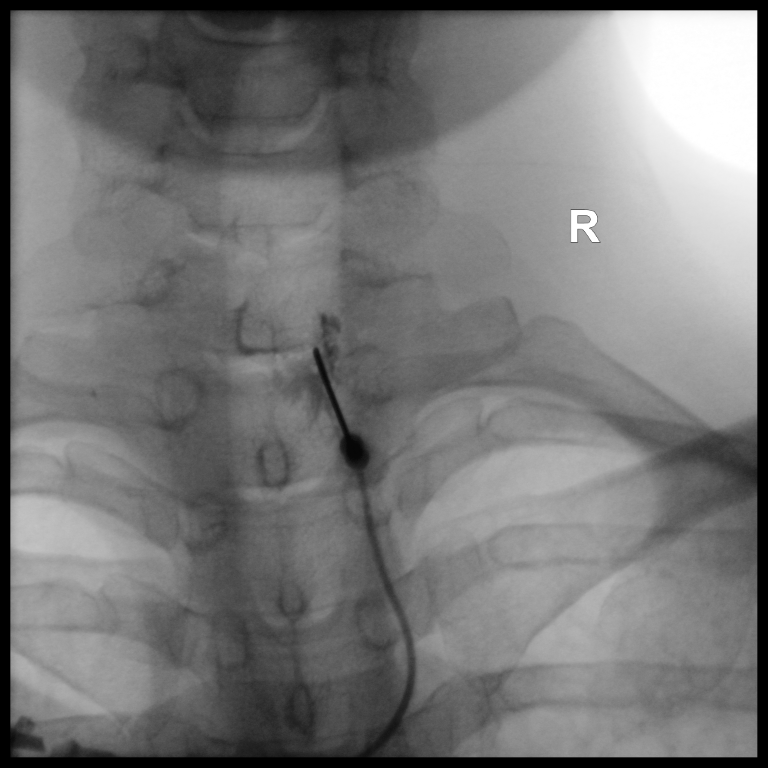

[Series 2: ortho standard · 1 of 1 slices shown (2 of 2)]
[im 1/1]
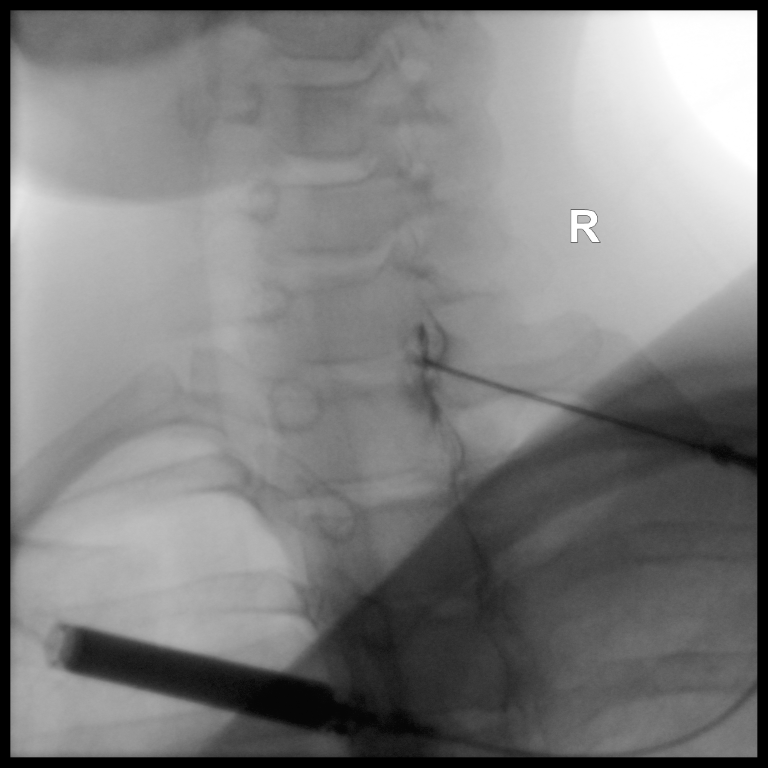

[2 of 2 positions shown; findings below may reference images not displayed]

FLUOROSCOPY TIME:  14 seconds corresponding to a Dose Area Product
of 8.17 Gy*m2

PROCEDURE:
Informed consent was obtained on the first visit. Time-out was
performed.

An appropriate skin entry site was chosen, cleansed with Betadine,
and anesthetized with 1% lidocaine.

CERVICAL EPIDURAL INJECTION

An interlaminar approach was performed on the RIGHT at C7-T1. A 20
gauge epidural needle was advanced using loss-of-resistance
technique.

DIAGNOSTIC EPIDURAL INJECTION

Injection of Isovue-M 300 shows a good epidural pattern with spread
above and below the level of needle placement, primarily on the
RIGHT. No vascular opacification is seen. THERAPEUTIC

EPIDURAL INJECTION

1.5 ml of Kenalog 40 mixed with 1 ml of 1% Lidocaine and 2 ml of
normal saline were then instilled. The procedure was well-tolerated,
and the patient was discharged thirty minutes following the
injection in good condition.
IMPRESSION: Technically successful second epidural injection on the RIGHT at
C7-T1.

## 2021-05-02 ENCOUNTER — Ambulatory Visit: Payer: Self-pay | Attending: Internal Medicine

## 2021-05-02 ENCOUNTER — Other Ambulatory Visit (HOSPITAL_BASED_OUTPATIENT_CLINIC_OR_DEPARTMENT_OTHER): Payer: Self-pay

## 2021-05-02 DIAGNOSIS — Z23 Encounter for immunization: Secondary | ICD-10-CM

## 2021-05-02 MED ORDER — PFIZER-BIONT COVID-19 VAC-TRIS 30 MCG/0.3ML IM SUSP
INTRAMUSCULAR | 0 refills | Status: AC
  Filled 2021-05-02: qty 0.3, 1d supply, fill #0

## 2021-05-02 NOTE — Progress Notes (Signed)
° °  Covid-[facility] Vaccination [facility]  Name:  Denise Warren    MRN: [id number] DOB: 1970/10/06  05/02/2021  Denise Warren was observed post Covid-19 immunization for 15 minutes without incident. She was provided with Vaccine Information Sheet and instruction to access the V-Safe system.   Denise Warren was instructed to call 911 with any severe reactions post vaccine: Difficulty breathing  Swelling of face and throat  A fast heartbeat  A bad rash all over body  Dizziness and weakness   Immunizations Administered     Name Date Dose VIS Date Route   PFIZER Comrnaty(Gray TOP) Covid-19 Vaccine 05/02/2021  2:59 PM 0.3 mL 01/09/2021 Intramuscular   Manufacturer: [employer], [employer]   Lot: [id number]   NDC: [phone]

## 2021-10-14 ENCOUNTER — Other Ambulatory Visit (HOSPITAL_COMMUNITY): Payer: Self-pay

## 2021-10-14 MED ORDER — PROGESTERONE MICRONIZED 100 MG PO CAPS
200.0000 mg | ORAL_CAPSULE | Freq: Every day | ORAL | 3 refills | Status: AC
  Filled 2021-10-14: qty 180, 90d supply, fill #0

## 2021-10-14 MED ORDER — ESCITALOPRAM OXALATE 10 MG PO TABS
10.0000 mg | ORAL_TABLET | Freq: Every day | ORAL | 1 refills | Status: AC
  Filled 2021-10-14: qty 90, 90d supply, fill #0

## 2021-10-18 ENCOUNTER — Other Ambulatory Visit (HOSPITAL_COMMUNITY): Payer: Self-pay

## 2021-12-04 ENCOUNTER — Other Ambulatory Visit (HOSPITAL_COMMUNITY): Payer: Self-pay

## 2021-12-06 ENCOUNTER — Other Ambulatory Visit (HOSPITAL_COMMUNITY): Payer: Self-pay

## 2021-12-10 ENCOUNTER — Other Ambulatory Visit (HOSPITAL_COMMUNITY): Payer: Self-pay

## 2021-12-10 MED ORDER — AMPHETAMINE-DEXTROAMPHETAMINE 20 MG PO TABS
20.0000 mg | ORAL_TABLET | Freq: Two times a day (BID) | ORAL | 0 refills | Status: AC
  Filled 2021-12-10 – 2022-01-06 (×2): qty 60, 30d supply, fill #0

## 2022-01-06 ENCOUNTER — Other Ambulatory Visit (HOSPITAL_COMMUNITY): Payer: Self-pay

## 2022-01-06 MED ORDER — AMPHETAMINE-DEXTROAMPHETAMINE 20 MG PO TABS
20.0000 mg | ORAL_TABLET | Freq: Two times a day (BID) | ORAL | 0 refills | Status: DC
  Filled 2022-02-04: qty 60, 30d supply, fill #0

## 2022-01-14 ENCOUNTER — Other Ambulatory Visit (HOSPITAL_COMMUNITY): Payer: Self-pay

## 2022-01-14 MED ORDER — SPIRONOLACTONE 25 MG PO TABS
25.0000 mg | ORAL_TABLET | Freq: Every day | ORAL | 1 refills | Status: DC
  Filled 2022-01-14: qty 90, 90d supply, fill #0
  Filled 2022-04-21: qty 90, 90d supply, fill #1

## 2022-01-14 MED ORDER — PROGESTERONE MICRONIZED 100 MG PO CAPS
200.0000 mg | ORAL_CAPSULE | Freq: Every evening | ORAL | 2 refills | Status: DC
  Filled 2022-01-14: qty 165, 83d supply, fill #0
  Filled 2022-01-14: qty 15, 7d supply, fill #0
  Filled 2022-04-21: qty 180, 90d supply, fill #1
  Filled 2022-07-31: qty 180, 90d supply, fill #2

## 2022-02-04 ENCOUNTER — Other Ambulatory Visit (HOSPITAL_COMMUNITY): Payer: Self-pay

## 2022-03-04 ENCOUNTER — Other Ambulatory Visit (HOSPITAL_COMMUNITY): Payer: Self-pay

## 2022-03-04 ENCOUNTER — Other Ambulatory Visit (HOSPITAL_BASED_OUTPATIENT_CLINIC_OR_DEPARTMENT_OTHER): Payer: Self-pay

## 2022-03-05 ENCOUNTER — Other Ambulatory Visit (HOSPITAL_COMMUNITY): Payer: Self-pay

## 2022-03-12 ENCOUNTER — Other Ambulatory Visit (HOSPITAL_COMMUNITY): Payer: Self-pay

## 2022-03-17 ENCOUNTER — Other Ambulatory Visit (HOSPITAL_COMMUNITY): Payer: Self-pay

## 2022-03-17 MED ORDER — AMPHETAMINE-DEXTROAMPHETAMINE 20 MG PO TABS
20.0000 mg | ORAL_TABLET | Freq: Two times a day (BID) | ORAL | 0 refills | Status: AC
  Filled 2022-03-17 – 2022-06-02 (×2): qty 60, 30d supply, fill #0

## 2022-04-21 ENCOUNTER — Other Ambulatory Visit (HOSPITAL_COMMUNITY): Payer: Self-pay

## 2022-04-24 ENCOUNTER — Other Ambulatory Visit (HOSPITAL_COMMUNITY): Payer: Self-pay

## 2022-04-24 MED ORDER — AMPHETAMINE-DEXTROAMPHETAMINE 20 MG PO TABS
20.0000 mg | ORAL_TABLET | Freq: Two times a day (BID) | ORAL | 0 refills | Status: DC
  Filled 2022-06-30: qty 60, 30d supply, fill #0

## 2022-04-24 MED ORDER — AMPHETAMINE-DEXTROAMPHETAMINE 20 MG PO TABS
20.0000 mg | ORAL_TABLET | Freq: Two times a day (BID) | ORAL | 0 refills | Status: AC
  Filled 2022-04-24 – 2022-05-01 (×2): qty 60, 30d supply, fill #0

## 2022-05-01 ENCOUNTER — Other Ambulatory Visit (HOSPITAL_COMMUNITY): Payer: Self-pay

## 2022-05-20 ENCOUNTER — Other Ambulatory Visit (HOSPITAL_COMMUNITY): Payer: Self-pay

## 2022-05-20 MED ORDER — SULFAMETHOXAZOLE-TRIMETHOPRIM 400-80 MG PO TABS
1.0000 | ORAL_TABLET | Freq: Two times a day (BID) | ORAL | 0 refills | Status: AC
  Filled 2022-05-20 – 2022-05-21 (×2): qty 20, 10d supply, fill #0

## 2022-05-20 MED ORDER — FLUCONAZOLE 150 MG PO TABS
150.0000 mg | ORAL_TABLET | ORAL | 0 refills | Status: AC
  Filled 2022-05-20: qty 2, 3d supply, fill #0

## 2022-05-21 ENCOUNTER — Other Ambulatory Visit (HOSPITAL_COMMUNITY): Payer: Self-pay

## 2022-06-02 ENCOUNTER — Other Ambulatory Visit (HOSPITAL_COMMUNITY): Payer: Self-pay

## 2022-06-30 ENCOUNTER — Other Ambulatory Visit (HOSPITAL_COMMUNITY): Payer: Self-pay

## 2022-07-02 ENCOUNTER — Other Ambulatory Visit (HOSPITAL_COMMUNITY): Payer: Self-pay

## 2022-07-16 ENCOUNTER — Other Ambulatory Visit (HOSPITAL_COMMUNITY): Payer: Self-pay

## 2022-07-16 MED ORDER — SPIRONOLACTONE 50 MG PO TABS
50.0000 mg | ORAL_TABLET | Freq: Every day | ORAL | 1 refills | Status: AC
  Filled 2022-07-16 – 2022-10-14 (×2): qty 90, 90d supply, fill #0

## 2022-07-16 MED ORDER — WINLEVI 1 % EX CREA
TOPICAL_CREAM | Freq: Two times a day (BID) | CUTANEOUS | 1 refills | Status: AC
  Filled 2022-07-16 – 2022-10-14 (×3): qty 60, 30d supply, fill #0

## 2022-07-31 ENCOUNTER — Other Ambulatory Visit (HOSPITAL_COMMUNITY): Payer: Self-pay

## 2022-08-08 ENCOUNTER — Other Ambulatory Visit (HOSPITAL_COMMUNITY): Payer: Self-pay

## 2022-10-14 ENCOUNTER — Other Ambulatory Visit (HOSPITAL_COMMUNITY): Payer: Self-pay

## 2022-10-14 ENCOUNTER — Other Ambulatory Visit: Payer: Self-pay

## 2022-10-14 MED ORDER — SPIRONOLACTONE 25 MG PO TABS
25.0000 mg | ORAL_TABLET | Freq: Every day | ORAL | 1 refills | Status: AC
  Filled 2022-10-14: qty 90, 90d supply, fill #0

## 2022-10-15 ENCOUNTER — Other Ambulatory Visit (HOSPITAL_COMMUNITY): Payer: Self-pay

## 2022-10-16 ENCOUNTER — Other Ambulatory Visit (HOSPITAL_COMMUNITY): Payer: Self-pay

## 2022-10-16 MED ORDER — AMPHETAMINE-DEXTROAMPHETAMINE 20 MG PO TABS
20.0000 mg | ORAL_TABLET | Freq: Two times a day (BID) | ORAL | 0 refills | Status: AC
  Filled 2022-11-13: qty 60, 30d supply, fill #0

## 2022-11-07 ENCOUNTER — Other Ambulatory Visit (HOSPITAL_COMMUNITY): Payer: Self-pay

## 2022-11-12 ENCOUNTER — Other Ambulatory Visit (HOSPITAL_COMMUNITY): Payer: Self-pay

## 2022-11-14 ENCOUNTER — Other Ambulatory Visit (HOSPITAL_COMMUNITY): Payer: Self-pay

## 2022-11-27 ENCOUNTER — Other Ambulatory Visit (HOSPITAL_COMMUNITY): Payer: Self-pay

## 2022-12-15 ENCOUNTER — Other Ambulatory Visit (HOSPITAL_COMMUNITY): Payer: Self-pay

## 2022-12-15 MED ORDER — PROGESTERONE MICRONIZED 100 MG PO CAPS
200.0000 mg | ORAL_CAPSULE | Freq: Every evening | ORAL | 2 refills | Status: AC
  Filled 2022-12-15: qty 180, 90d supply, fill #0

## 2022-12-25 ENCOUNTER — Other Ambulatory Visit (HOSPITAL_COMMUNITY): Payer: Self-pay

## 2023-01-05 ENCOUNTER — Other Ambulatory Visit: Payer: Self-pay

## 2023-01-05 ENCOUNTER — Emergency Department (HOSPITAL_COMMUNITY)
Admission: EM | Admit: 2023-01-05 | Discharge: 2023-01-05 | Disposition: A | Discharge status: Home / Self Care | Payer: Commercial Managed Care - PPO | Source: Home / Self Care

## 2023-01-05 ENCOUNTER — Encounter (HOSPITAL_COMMUNITY): Payer: Self-pay | Admitting: *Deleted

## 2023-01-05 ENCOUNTER — Emergency Department (HOSPITAL_COMMUNITY): Payer: Commercial Managed Care - PPO

## 2023-01-05 DIAGNOSIS — M25512 Pain in left shoulder: Secondary | ICD-10-CM | POA: Diagnosis not present

## 2023-01-05 DIAGNOSIS — S161XXA Strain of muscle, fascia and tendon at neck level, initial encounter: Secondary | ICD-10-CM | POA: Insufficient documentation

## 2023-01-05 DIAGNOSIS — S40012A Contusion of left shoulder, initial encounter: Secondary | ICD-10-CM | POA: Diagnosis not present

## 2023-01-05 DIAGNOSIS — S199XXA Unspecified injury of neck, initial encounter: Secondary | ICD-10-CM | POA: Diagnosis not present

## 2023-01-05 DIAGNOSIS — M25522 Pain in left elbow: Secondary | ICD-10-CM | POA: Diagnosis not present

## 2023-01-05 DIAGNOSIS — M47814 Spondylosis without myelopathy or radiculopathy, thoracic region: Secondary | ICD-10-CM | POA: Diagnosis not present

## 2023-01-05 DIAGNOSIS — M5010 Cervical disc disorder with radiculopathy, unspecified cervical region: Secondary | ICD-10-CM | POA: Diagnosis not present

## 2023-01-05 DIAGNOSIS — R079 Chest pain, unspecified: Secondary | ICD-10-CM | POA: Diagnosis not present

## 2023-01-05 DIAGNOSIS — M79602 Pain in left arm: Secondary | ICD-10-CM | POA: Diagnosis not present

## 2023-01-05 MED ORDER — NAPROXEN 500 MG PO TABS: 500.0000 mg | ORAL_TABLET | Freq: Two times a day (BID) | ORAL | 0 refills | Status: AC

## 2023-01-05 MED ORDER — OXYCODONE-ACETAMINOPHEN 5-325 MG PO TABS
1.0000 | ORAL_TABLET | Freq: Once | ORAL | Status: AC
  Administered 2023-01-05: 1 via ORAL
  Filled 2023-01-05: qty 1

## 2023-01-05 MED ORDER — METHOCARBAMOL 500 MG PO TABS: 500.0000 mg | ORAL_TABLET | Freq: Three times a day (TID) | ORAL | 0 refills | Status: AC

## 2023-01-05 NOTE — ED Notes (Signed)
Pt would like to have law enforcement take a police report.
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
## 2023-01-05 NOTE — Discharge Instructions (Addendum)
Your CT scan and X-rays did not show any acute traumatic injuries.  Please take the anti-inflammatories and muscle relaxers as prescribed and follow-up with your doctor.  Do not drive or drink alcohol while taking the muscle relaxers as it may make you drowsy.  Return to the ER for worsening symptoms.
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
## 2023-01-05 NOTE — ED Notes (Addendum)
Went in to d/c pt home. Pt states not having a ride and is very drowsy and sleepy from getting oxy. Consulting civil engineer notified. Pt to stay in room and sleep off some of the medicine before d/c.
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
## 2023-01-07 ENCOUNTER — Other Ambulatory Visit (HOSPITAL_COMMUNITY): Payer: Self-pay

## 2023-05-29 ENCOUNTER — Other Ambulatory Visit (HOSPITAL_COMMUNITY): Payer: Self-pay

## 2023-06-01 ENCOUNTER — Other Ambulatory Visit (HOSPITAL_COMMUNITY): Payer: Self-pay

## 2024-04-20 ENCOUNTER — Other Ambulatory Visit (HOSPITAL_BASED_OUTPATIENT_CLINIC_OR_DEPARTMENT_OTHER): Payer: Self-pay

## 2024-04-20 DIAGNOSIS — L700 Acne vulgaris: Secondary | ICD-10-CM | POA: Diagnosis not present

## 2024-04-20 MED ORDER — TRETINOIN 0.025 % EX CREA
TOPICAL_CREAM | CUTANEOUS | 11 refills | Status: AC
  Filled 2024-04-20: qty 45, 30d supply, fill #0

## 2024-04-20 MED ORDER — SPIRONOLACTONE 50 MG PO TABS
50.0000 mg | ORAL_TABLET | Freq: Two times a day (BID) | ORAL | 11 refills | Status: AC
  Filled 2024-04-20: qty 60, 30d supply, fill #0

## 2024-04-21 ENCOUNTER — Other Ambulatory Visit (HOSPITAL_BASED_OUTPATIENT_CLINIC_OR_DEPARTMENT_OTHER): Payer: Self-pay
